# Patient Record
Sex: Male | Born: 2000 | Race: White | Hispanic: No | Marital: Single | Smoking: Never smoker
Health system: Southern US, Community
[De-identification: ages and names within clinical notes are randomized; demographics above are authoritative.]

## PROBLEM LIST (undated history)

## (undated) DIAGNOSIS — J45909 Unspecified asthma, uncomplicated: Secondary | ICD-10-CM

---
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
## 2020-06-05 IMAGING — RF DG ARTHROGRAM SHOULDER*R*
1 series · 1 of 1 positions shown · non-contrast
Comparison: None.

INDICATION: Right shoulder pain.

EXAM:
RIGHT SHOULDER ARTHROGRAM UNDER FLUOROSCOPIC GUIDANCE

[Series 1: cp_standard · 0.28mm/px · 1 of 1 slices shown]
[im 1/1]
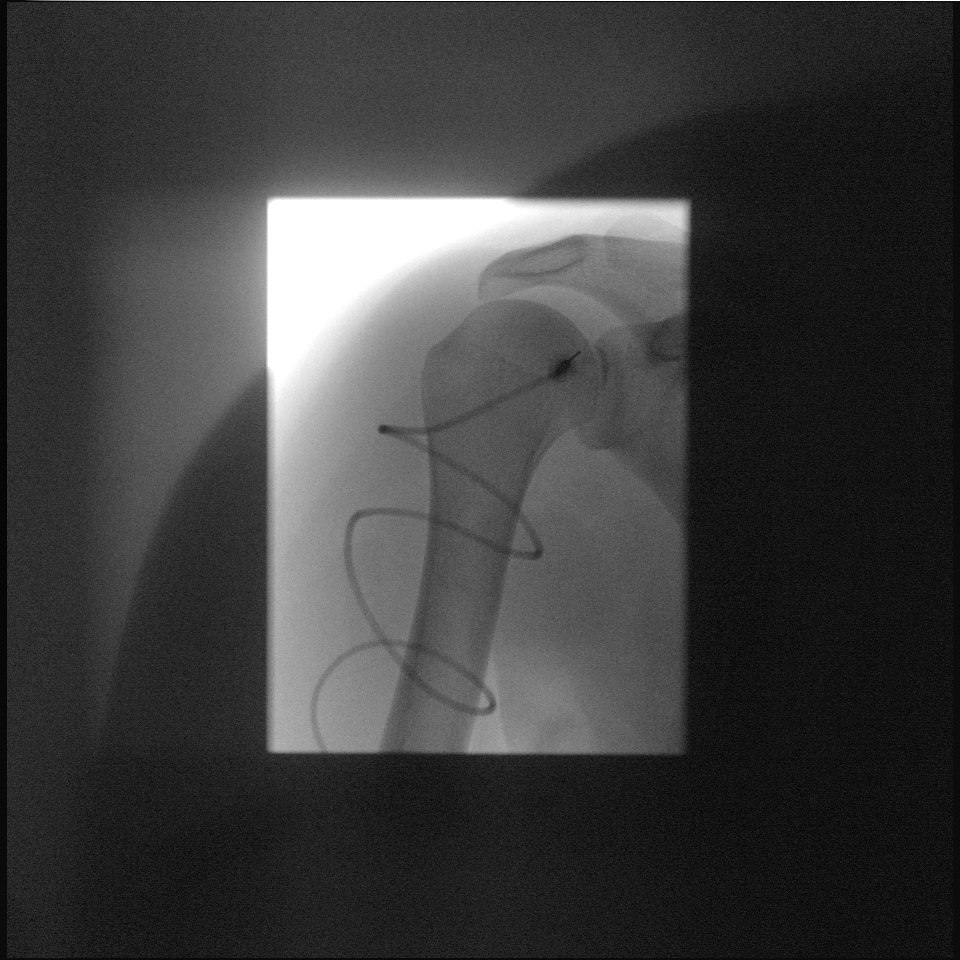

[1 of 1 positions shown; findings below may reference images not displayed]

FLUOROSCOPY TIME:  Fluoroscopy Time:  0.2 minute

Radiation Exposure Index (if provided by the fluoroscopic device):
0.4 mGy

Number of Acquired Spot Images: 0

COMPLICATIONS:
None immediate.

PROCEDURE:
The risks and benefits of the procedure were discussed with the
patient, and written informed consent was obtained. The patient
stated no history of allergy to contrast media. A formal timeout
procedure was performed with the patient according to departmental
protocol.

The patient was placed supine on the fluoroscopy table and the right
glenohumeral joint was identified under fluoroscopy. The skin
overlying the right glenohumeral joint was subsequently cleaned with
Chloraprep and a sterile drape was placed over the area of interest.
5 ml 1% Lidocaine was used to anesthetize the skin around the needle
insertion site.

A 22 gauge spinal needle was inserted into the right glenohumeral
joint under fluoroscopy. Position was confirmed with injection of
less than 1ml of Ssovue-V77 under fluoroscopy.

12 ml of gadolinium mixture (0.1 ml of Multihance mixed with 14 ml
of sterile saline and 6 mL Ssovue-V77) was injected into the right
glenohumeral joint.

The needle was removed and hemostasis was achieved. The patient was
subsequently transferred to MRI for imaging.
IMPRESSION: Successful fluoroscopic guided right shoulder arthrogram prior to
MRI.

## 2020-06-05 IMAGING — MR MR SHOULDER*R* W/CM
6 series · 40 of 40 positions shown · IV contrast (agent unspecified)
Comparison: 07/31/2017

CLINICAL DATA: Right football injury July 2017. Had physical
therapy but not 100%.

EXAM:
MR ARTHROGRAM OF THE RIGHT SHOULDER
TECHNIQUE: Multiplanar, multisequence MR imaging of the right shoulder was
performed following the administration of intra-articular contrast.
CONTRAST:  See Injection Documentation.

[Series 5: T1 fat-sat · axial · right · 4.0mm · 0.55mm/px · z∈[-58,+62]mm · 7 of 25 slices shown (1 of 3)]
[im 1/25]
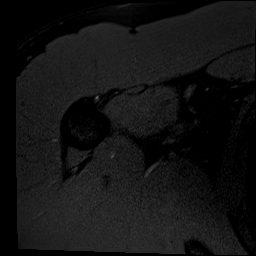
[im 5/25]
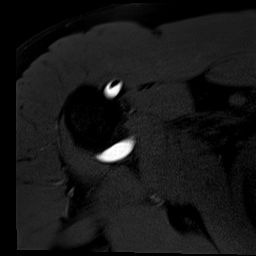
[im 9/25]
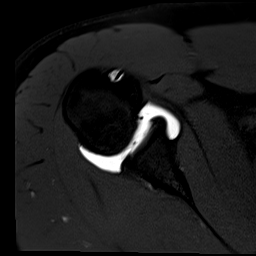
[im 13/25]
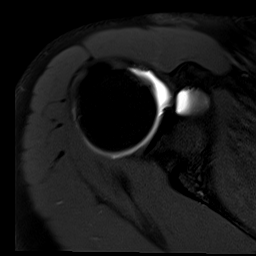
[im 17/25]
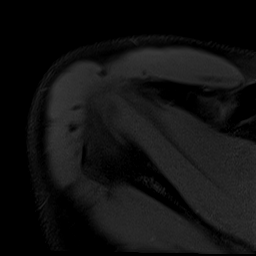
[im 21/25]
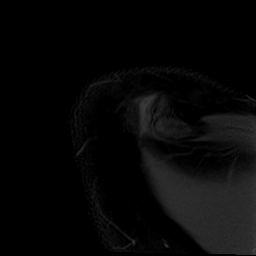
[im 25/25]
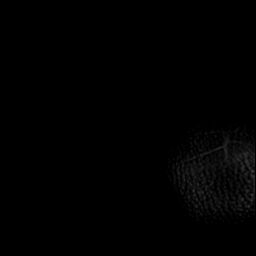

[Series 6: T2 fat-sat · oblique · right · 4.0mm · 0.55mm/px · 7 of 22 slices shown (1 of 2)]
[im 1/22]
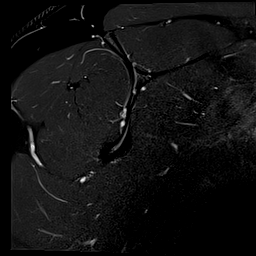
[im 4/22]
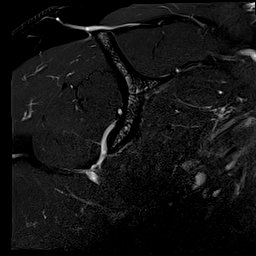
[im 8/22]
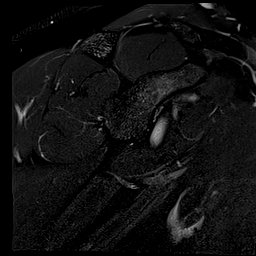
[im 11/22]
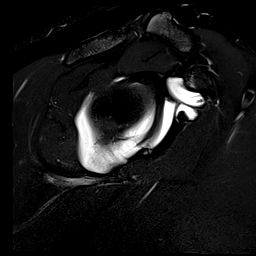
[im 15/22]
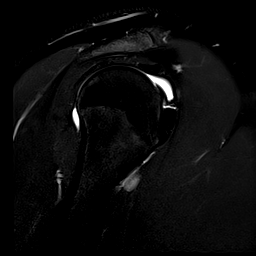
[im 18/22]
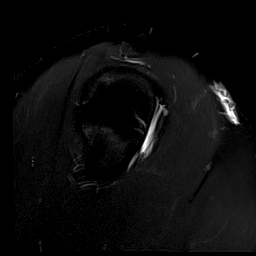
[im 22/22]
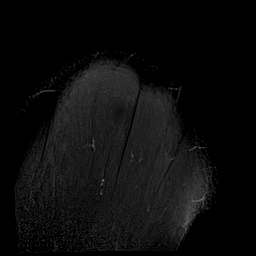

[Series 7: T1 fat-sat · oblique · right · 4.0mm · 0.55mm/px · 6 of 19 slices shown (2 of 3)]
[im 1/19]
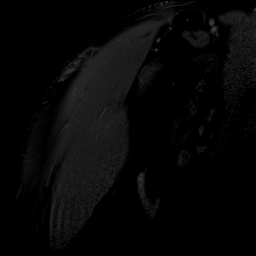
[im 4/19]
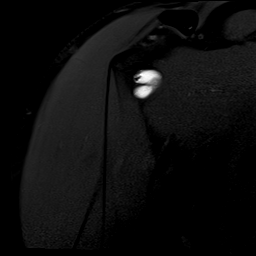
[im 8/19]
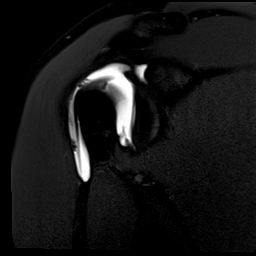
[im 11/19]
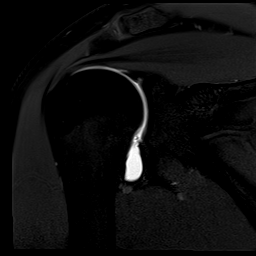
[im 15/19]
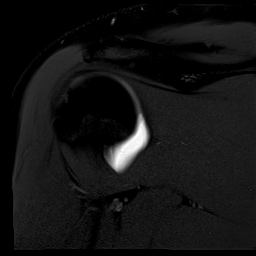
[im 19/19]
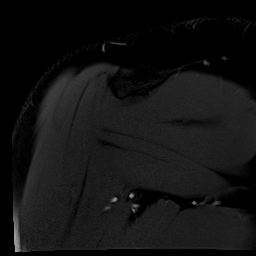

[Series 8: T2 fat-sat · oblique · right · 4.0mm · 0.55mm/px · 6 of 19 slices shown (2 of 2)]
[im 1/19]
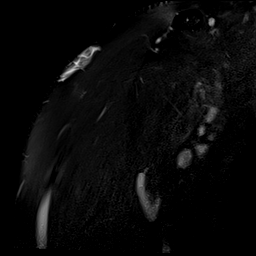
[im 4/19]
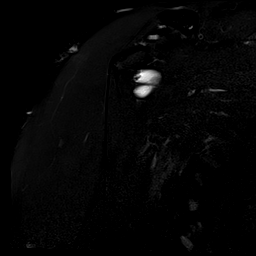
[im 8/19]
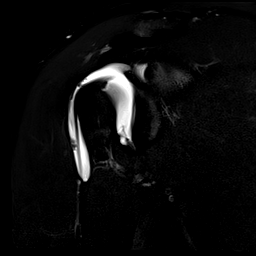
[im 11/19]
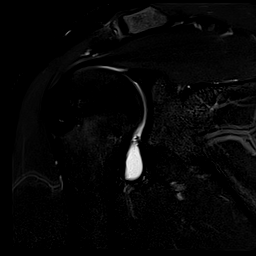
[im 15/19]
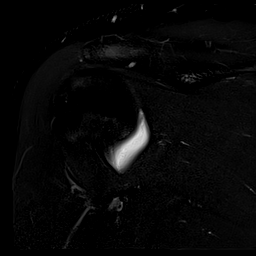
[im 19/19]
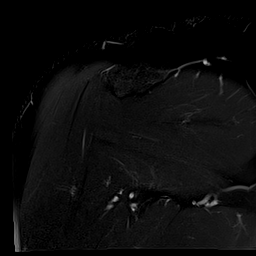

[Series 9: T1 · oblique · right · 4.0mm · 0.51mm/px · 6 of 19 slices shown]
[im 1/19]
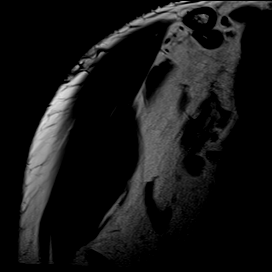
[im 4/19]
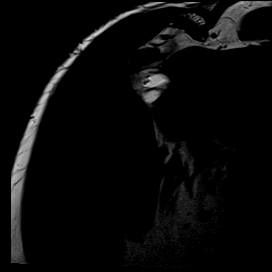
[im 8/19]
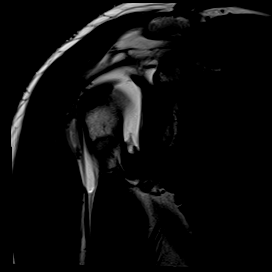
[im 11/19]
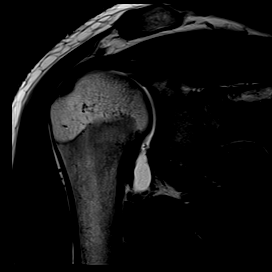
[im 15/19]
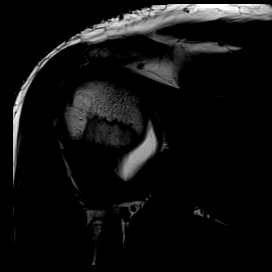
[im 19/19]
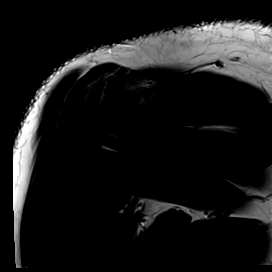

[Series 11: T1 fat-sat · sagittal · right · 4.0mm · 0.62mm/px · 8 of 26 slices shown (3 of 3)]
[im 1/26]
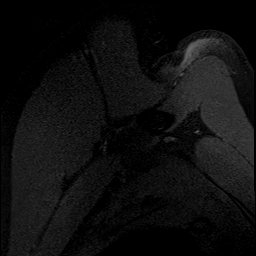
[im 4/26]
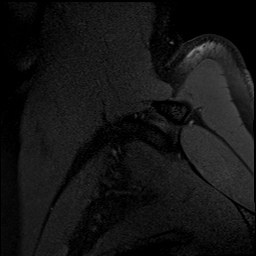
[im 8/26]
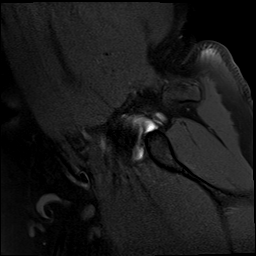
[im 11/26]
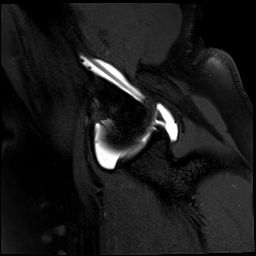
[im 15/26]
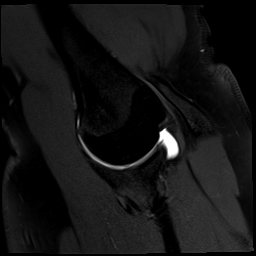
[im 18/26]
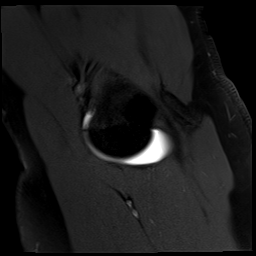
[im 22/26]
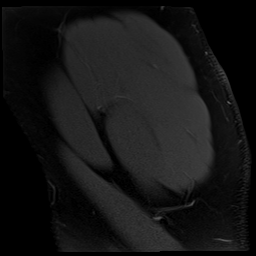
[im 26/26]
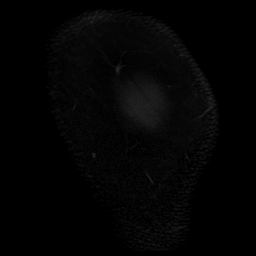

[40 of 40 positions shown; findings below may reference images not displayed]

FINDINGS: Rotator cuff: Supraspinatus tendon is intact. Infraspinatus tendon
is intact. Teres minor tendon is intact. Subscapularis tendon is
intact.

Muscles: No atrophy or fatty replacement of nor abnormal signal
within, the muscles of the rotator cuff.

Biceps long head: Intact.

Acromioclavicular Joint: Normal acromioclavicular joint. Type II
acromion. No subacromial/subdeltoid bursal contrast or fluid.

Glenohumeral Joint: Intraarticular contrast distending the joint
capsule. Normal glenohumeral ligaments. No chondral defect. Thin low
signal strain of soft tissue along the inferior glenohumeral joint
likely reflecting a condensation of synovium.

Labrum: No discrete labral tear. Slight irregularity of the inferior
most aspect of the labrum on a single image.

Bones: No acute osseous abnormality.  No aggressive osseous lesion.
IMPRESSION: 1. No internal derangement of the right shoulder.

## 2020-09-29 ENCOUNTER — Other Ambulatory Visit: Payer: Self-pay

## 2022-06-23 ENCOUNTER — Other Ambulatory Visit (INDEPENDENT_AMBULATORY_CARE_PROVIDER_SITE_OTHER): Payer: Self-pay | Admitting: Nurse Practitioner

## 2022-06-23 DIAGNOSIS — I83891 Varicose veins of right lower extremities with other complications: Secondary | ICD-10-CM

## 2022-06-27 ENCOUNTER — Ambulatory Visit (INDEPENDENT_AMBULATORY_CARE_PROVIDER_SITE_OTHER): Payer: No Typology Code available for payment source

## 2022-06-27 ENCOUNTER — Ambulatory Visit (INDEPENDENT_AMBULATORY_CARE_PROVIDER_SITE_OTHER): Payer: Self-pay | Admitting: Vascular Surgery

## 2022-06-27 ENCOUNTER — Encounter (INDEPENDENT_AMBULATORY_CARE_PROVIDER_SITE_OTHER): Payer: Self-pay | Admitting: Vascular Surgery

## 2022-06-27 VITALS — BP 121/78 | HR 73 | Resp 16 | Ht 76.0 in | Wt 310.0 lb

## 2022-06-27 DIAGNOSIS — I83891 Varicose veins of right lower extremities with other complications: Secondary | ICD-10-CM | POA: Diagnosis not present

## 2022-06-27 DIAGNOSIS — I83893 Varicose veins of bilateral lower extremities with other complications: Secondary | ICD-10-CM

## 2022-06-27 DIAGNOSIS — J45909 Unspecified asthma, uncomplicated: Secondary | ICD-10-CM

## 2022-07-02 ENCOUNTER — Encounter (INDEPENDENT_AMBULATORY_CARE_PROVIDER_SITE_OTHER): Payer: Self-pay | Admitting: Vascular Surgery

## 2022-07-02 DIAGNOSIS — I83893 Varicose veins of bilateral lower extremities with other complications: Secondary | ICD-10-CM | POA: Insufficient documentation

## 2022-07-02 DIAGNOSIS — J45909 Unspecified asthma, uncomplicated: Secondary | ICD-10-CM | POA: Insufficient documentation

## 2022-07-02 NOTE — Progress Notes (Signed)
MRN : [id number]  RAYNALD PARIS is a 22 y.o. (Mar 23, 2001) male who presents with chief complaint of varicose veins right leg.  History of Present Illness:   The patient is seen for evaluation of  varicose veins. The patient relates minimal burning and stinging. He states his symptoms are minimal but the veins have gotten bigger.  There is no history of DVT, PE or superficial thrombophlebitis. There is no history of ulceration or hemorrhage. The patient denies a significant family history of varicose veins.  The patient has not worn graduated compression in the past. At the present time the patient has not been using over-the-counter analgesics. There is no history of prior surgical intervention or sclerotherapy.   Current Meds  Medication Sig   albuterol (VENTOLIN HFA) 108 (90 Base) MCG/ACT inhaler Inhale 2 puffs into the lungs every 4 (four) hours as needed.    Past Medical History:  Diagnosis Date   Asthma    as younger child.  No current issues or meds.    Past Surgical History:  Procedure Laterality Date   SHOULDER ARTHROSCOPY WITH SUBACROMIAL DECOMPRESSION AND BICEP TENDON REPAIR Right 05/18/2018   Procedure: RIGHT POSTERIOR LABRAL REPAIR AND CAPSULARRAPHY;  Surgeon: Leim Fabry, MD;  Location: [facility];  Service: Orthopedics;  Laterality: Right;  TODD MUNDY ASSISTING ARTHREX 3.0 MM KNOTLESS SUTURE TAK 1.6 MM FIBER TAK STRAIGHT AND CURVED, SUTURE PASSING LESSOS,LABRAL TAPE 2.9 MM PUSH LOCK ARTHROCARE WAND   SHOULDER SURGERY Left 11/05/2015   [facility]    Social History Social History   Tobacco Use   Smoking status: Never   Smokeless tobacco: Never  Vaping Use   Vaping Use: Never used  Substance Use Topics   Alcohol use: Never   Drug use: Never    Family History Family History  Problem Relation Age of Onset   Cancer Father     No Known Allergies   REVIEW OF SYSTEMS (Negative unless checked)  Constitutional: '[]'$ Weight loss  '[]'$ Fever   '[]'$ Chills Cardiac: '[]'$ Chest pain   '[]'$ Chest pressure   '[]'$ Palpitations   '[]'$ Shortness of breath when laying flat   '[]'$ Shortness of breath with exertion. Vascular:  '[]'$ Pain in legs with walking   '[x]'$ Pain in legs with standing  '[]'$ History of DVT   '[]'$ Phlebitis   '[]'$ Swelling in legs   '[x]'$ Varicose veins   '[]'$ Non-healing ulcers Pulmonary:   '[]'$ Uses home oxygen   '[]'$ Productive cough   '[]'$ Hemoptysis   '[]'$ Wheeze  '[]'$ COPD   '[]'$ Asthma Neurologic:  '[]'$ Dizziness   '[]'$ Seizures   '[]'$ History of stroke   '[]'$ History of TIA  '[]'$ Aphasia   '[]'$ Vissual changes   '[]'$ Weakness or numbness in arm   '[]'$ Weakness or numbness in leg Musculoskeletal:   '[]'$ Joint swelling   '[]'$ Joint pain   '[]'$ Low back pain Hematologic:  '[]'$ Easy bruising  '[]'$ Easy bleeding   '[]'$ Hypercoagulable state   '[]'$ Anemic Gastrointestinal:  '[]'$ Diarrhea   '[]'$ Vomiting  '[]'$ Gastroesophageal reflux/heartburn   '[]'$ Difficulty swallowing. Genitourinary:  '[]'$ Chronic kidney disease   '[]'$ Difficult urination  '[]'$ Frequent urination   '[]'$ Blood in urine Skin:  '[]'$ Rashes   '[]'$ Ulcers  Psychological:  '[]'$ History of anxiety   '[]'$  History of major depression.  Physical Examination  Vitals:   06/27/22 1455  BP: 121/78  Pulse: 73  Resp: 16  Weight: (!) 310 lb (140.6 kg)  Height: '6\' 4"'$  (1.93 m)   Body mass index is 37.73 kg/m. Gen: WD/WN, NAD Head: [place]/AT, No temporalis wasting.  Ear/Nose/Throat: Hearing grossly intact, nares w/o erythema or drainage, pinna without lesions  Eyes: PER, EOMI, sclera nonicteric.  Neck: Supple, no gross masses.  No JVD.  Pulmonary:  Good air movement, no audible wheezing, no use of accessory muscles.  Cardiac: RRR, precordium not hyperdynamic. Vascular:  Large varicosities present, greater than 10 mm right.  Veins are tender to palpation  Mild venous stasis changes to the legs bilaterally.  Trace soft pitting edema CEAP [id number] Vessel Right Left  Radial Palpable Palpable  Gastrointestinal: soft, non-distended. No guarding/no peritoneal signs.  Musculoskeletal: M/S 5/5 throughout.   No deformity.  Neurologic: CN 2-12 intact. Pain and light touch intact in extremities.  Symmetrical.  Speech is fluent. Motor exam as listed above. Psychiatric: Judgment intact, Mood & affect appropriate for pt's clinical situation. Dermatologic: Venous rashes no ulcers noted.  No changes consistent with cellulitis. Lymph : No lichenification or skin changes of chronic lymphedema.  CBC No results found for: "WBC", "HGB", "HCT", "MCV", "PLT"  BMET No results found for: "NA", "K", "CL", "CO2", "GLUCOSE", "BUN", "CREATININE", "CALCIUM", "GFRNONAA", "GFRAA" CrCl cannot be calculated (No successful lab value found.).  COAG No results found for: "INR", "PROTIME"  Radiology No results found.   Assessment/Plan 1. Varicose veins of bilateral lower extremities with other complications Recommend:  The patient is complaining of varicose veins.    I have had a long discussion with the patient regarding  varicose veins and why they cause symptoms.  Patient will begin wearing graduated compression stockings on a daily basis, beginning first thing in the morning and removing them in the evening. The patient is instructed specifically not to sleep in the stockings.    The patient  will also begin using over-the-counter analgesics such as Motrin 600 mg po TID to help control the symptoms as needed.    In addition, behavioral modification including elevation during the day will be initiated, utilizing a recliner was recommended.  The patient is also instructed to continue exercising such as walking 4-5 times per week.  At this time the patient wishes to continue conservative therapy and is not interested in more invasive treatments such as laser ablation and sclerotherapy.  The Patient will follow up PRN if the symptoms worsen.  2. Mild asthma, unspecified whether complicated, unspecified whether persistent Continue pulmonary medications and aerosols as already ordered, these medications have been  reviewed and there are no changes at this time.     Hortencia Pilar, MD  07/02/2022 11:38 AM

## 2022-09-23 ENCOUNTER — Ambulatory Visit
Admission: EM | Admit: 2022-09-23 | Discharge: 2022-09-23 | Disposition: A | Discharge status: Home / Self Care | Payer: BLUE CROSS/BLUE SHIELD | Attending: Emergency Medicine | Admitting: Emergency Medicine

## 2022-09-23 DIAGNOSIS — B349 Viral infection, unspecified: Secondary | ICD-10-CM

## 2022-09-23 DIAGNOSIS — J029 Acute pharyngitis, unspecified: Secondary | ICD-10-CM

## 2022-09-23 LAB — POCT RAPID STREP A (OFFICE): Rapid Strep A Screen: NEGATIVE

## 2022-09-23 NOTE — ED Triage Notes (Signed)
Patient to Urgent Care with complaints of fevers/ headaches/ sore throat.  Reports he had some tonsil stones about one week ago and his tonsils have hurt since he removed them. Fevers/ headaches that started yesterday. Poor appetite and fatigue.  Tylenol this AM.

## 2022-09-23 NOTE — Discharge Instructions (Addendum)
The strep test is negative.    Take Tylenol or ibuprofen as needed for fever or discomfort.  Follow up with your primary care provider if your symptoms are not improving.    

## 2022-09-23 NOTE — ED Provider Notes (Signed)
Nicholas Doyle    CSN: [id number] Arrival date & time: 09/23/22  1307      History   Chief Complaint Chief Complaint  Patient presents with   Fever    Chills sore throat especially on left side weakness fatigue decreased appetite; previous tonsil stone coughed up about a week ago - Entered by patient    HPI Nicholas Doyle is a 22 y.o. male.  Patient presents with 1 day history of fever and sore throat.  He also has generalized fatigue and headache.  Tmax 101.  Treatment at home with Tylenol.  He denies rash, cough, wheezing, shortness of breath, or other symptoms.  He had tonsil stones about a week ago that he removed without difficulty and had no sore throat at that time.  His medical history includes asthma.  The history is provided by the patient and medical records.    Past Medical History:  Diagnosis Date   Asthma    as younger child.  No current issues or meds.    Patient Active Problem List   Diagnosis Date Noted   Varicose veins of bilateral lower extremities with other complications 07/02/2022   Asthma 07/02/2022    Past Surgical History:  Procedure Laterality Date   SHOULDER ARTHROSCOPY WITH SUBACROMIAL DECOMPRESSION AND BICEP TENDON REPAIR Right 05/18/2018   Procedure: RIGHT POSTERIOR LABRAL REPAIR AND CAPSULARRAPHY;  Surgeon: Signa Kell, MD;  Location: [facility];  Service: Orthopedics;  Laterality: Right;  TODD MUNDY ASSISTING ARTHREX 3.0 MM KNOTLESS SUTURE TAK 1.6 MM FIBER TAK STRAIGHT AND CURVED, SUTURE PASSING LESSOS,LABRAL TAPE 2.9 MM PUSH LOCK ARTHROCARE WAND   SHOULDER SURGERY Left 11/05/2015   [facility]       Home Medications    Prior to Admission medications   Medication Sig Start Date End Date Taking? Authorizing Provider  albuterol (VENTOLIN HFA) 108 (90 Base) MCG/ACT inhaler Inhale 2 puffs into the lungs every 4 (four) hours as needed. 04/09/21   [provider]  ondansetron (ZOFRAN ODT) 4 MG disintegrating tablet Take  1 tablet (4 mg total) by mouth every 8 (eight) hours as needed for nausea or vomiting. 05/18/18   Signa Kell, MD    Family History Family History  Problem Relation Age of Onset   Cancer Father     Social History Social History   Tobacco Use   Smoking status: Never   Smokeless tobacco: Never  Vaping Use   Vaping Use: Never used  Substance Use Topics   Alcohol use: Never   Drug use: Never     Allergies   Patient has no known allergies.   Review of Systems Review of Systems  Constitutional:  Positive for fatigue and fever. Negative for chills.  HENT:  Positive for sore throat. Negative for ear pain, trouble swallowing and voice change.   Respiratory:  Negative for cough and shortness of breath.   Cardiovascular:  Negative for chest pain and palpitations.  Gastrointestinal:  Negative for diarrhea and vomiting.  Skin:  Negative for rash.     Physical Exam Triage Vital Signs ED Triage Vitals  Enc Vitals Group     BP 09/23/22 1423 127/84     Pulse Rate 09/23/22 1423 90     Resp 09/23/22 1423 18     Temp 09/23/22 1423 99.8 F (37.7 C)     Temp src --      SpO2 09/23/22 1423 98 %     Weight --      Height --  Head Circumference --      Peak Flow --      Pain Score 09/23/22 1422 0     Pain Loc --      Pain Edu? --      Excl. in GC? --    No data found.  Updated Vital Signs BP 127/84   Pulse 90   Temp 99.8 F (37.7 C)   Resp 18   SpO2 98%   Visual Acuity Right Eye Distance:   Left Eye Distance:   Bilateral Distance:    Right Eye Near:   Left Eye Near:    Bilateral Near:     Physical Exam Vitals and nursing note reviewed.  Constitutional:      General: He is not in acute distress.    Appearance: Normal appearance. He is well-developed. He is not ill-appearing.  HENT:     Right Ear: Tympanic membrane normal.     Left Ear: Tympanic membrane normal.     Nose: Nose normal.     Mouth/Throat:     Mouth: Mucous membranes are moist.      Pharynx: Posterior oropharyngeal erythema present.     Tonsils: 0 on the right. 0 on the left.  Cardiovascular:     Rate and Rhythm: Normal rate and regular rhythm.     Heart sounds: Normal heart sounds.  Pulmonary:     Effort: Pulmonary effort is normal. No respiratory distress.     Breath sounds: Normal breath sounds.  Musculoskeletal:     Cervical back: Neck supple.  Skin:    General: Skin is warm and dry.  Neurological:     Mental Status: He is alert.  Psychiatric:        Mood and Affect: Mood normal.        Behavior: Behavior normal.      UC Treatments / Results  Labs (all labs ordered are listed, but only abnormal results are displayed) Labs Reviewed  POCT RAPID STREP A (OFFICE)    EKG   Radiology No results found.  Procedures Procedures (including critical care time)  Medications Ordered in UC Medications - No data to display  Initial Impression / Assessment and Plan / UC Course  I have reviewed the triage vital signs and the nursing notes.  Pertinent labs & imaging results that were available during my care of the patient were reviewed by me and considered in my medical decision making (see chart for details).    Viral pharyngitis, viral illness.  Rapid strep negative.  Discussed symptomatic treatment including Tylenol or ibuprofen.  Instructed patient to follow up with his PCP if symptoms are not improving.  Education provided on pharyngitis and viral illness.  He agrees to plan of care.   Final Clinical Impressions(s) / UC Diagnoses   Final diagnoses:  Viral pharyngitis  Viral illness     Discharge Instructions      The strep test is negative.    Take Tylenol or ibuprofen as needed for fever or discomfort.     Follow-up with your primary care provider if your symptoms are not improving.         ED Prescriptions   None    PDMP not reviewed this encounter.   Mickie Bail, NP 09/23/22 [phone]
